# Patient Record
Sex: Male | Born: 1940 | Race: Black or African American | Hispanic: No | Marital: Married | Smoking: Never smoker
Health system: Southern US, Community
[De-identification: ages and names within clinical notes are randomized; demographics above are authoritative.]

---

## 2016-12-05 ENCOUNTER — Emergency Department (HOSPITAL_COMMUNITY): Payer: Medicare (Managed Care)

## 2016-12-05 ENCOUNTER — Encounter (HOSPITAL_COMMUNITY): Payer: Self-pay | Admitting: Emergency Medicine

## 2016-12-05 ENCOUNTER — Inpatient Hospital Stay (HOSPITAL_COMMUNITY)
Admission: EM | Admit: 2016-12-05 | Discharge: 2016-12-08 | DRG: 871 | Disposition: A | Discharge status: Home / Self Care | Payer: Medicare (Managed Care) | Attending: Internal Medicine | Admitting: Internal Medicine

## 2016-12-05 DIAGNOSIS — Z23 Encounter for immunization: Secondary | ICD-10-CM

## 2016-12-05 DIAGNOSIS — G9340 Encephalopathy, unspecified: Secondary | ICD-10-CM | POA: Diagnosis present

## 2016-12-05 DIAGNOSIS — Z885 Allergy status to narcotic agent status: Secondary | ICD-10-CM | POA: Diagnosis not present

## 2016-12-05 DIAGNOSIS — J9601 Acute respiratory failure with hypoxia: Secondary | ICD-10-CM | POA: Diagnosis present

## 2016-12-05 DIAGNOSIS — N179 Acute kidney failure, unspecified: Secondary | ICD-10-CM | POA: Diagnosis present

## 2016-12-05 DIAGNOSIS — J449 Chronic obstructive pulmonary disease, unspecified: Secondary | ICD-10-CM | POA: Diagnosis present

## 2016-12-05 DIAGNOSIS — E876 Hypokalemia: Secondary | ICD-10-CM | POA: Diagnosis present

## 2016-12-05 DIAGNOSIS — N289 Disorder of kidney and ureter, unspecified: Secondary | ICD-10-CM

## 2016-12-05 DIAGNOSIS — K219 Gastro-esophageal reflux disease without esophagitis: Secondary | ICD-10-CM | POA: Diagnosis present

## 2016-12-05 DIAGNOSIS — J181 Lobar pneumonia, unspecified organism: Secondary | ICD-10-CM | POA: Diagnosis not present

## 2016-12-05 DIAGNOSIS — Z79899 Other long term (current) drug therapy: Secondary | ICD-10-CM

## 2016-12-05 DIAGNOSIS — D649 Anemia, unspecified: Secondary | ICD-10-CM | POA: Diagnosis present

## 2016-12-05 DIAGNOSIS — E860 Dehydration: Secondary | ICD-10-CM | POA: Diagnosis present

## 2016-12-05 DIAGNOSIS — G9341 Metabolic encephalopathy: Secondary | ICD-10-CM | POA: Diagnosis present

## 2016-12-05 DIAGNOSIS — J189 Pneumonia, unspecified organism: Secondary | ICD-10-CM | POA: Diagnosis present

## 2016-12-05 DIAGNOSIS — Z888 Allergy status to other drugs, medicaments and biological substances status: Secondary | ICD-10-CM | POA: Diagnosis not present

## 2016-12-05 DIAGNOSIS — I499 Cardiac arrhythmia, unspecified: Secondary | ICD-10-CM | POA: Diagnosis present

## 2016-12-05 DIAGNOSIS — R262 Difficulty in walking, not elsewhere classified: Secondary | ICD-10-CM

## 2016-12-05 DIAGNOSIS — A419 Sepsis, unspecified organism: Secondary | ICD-10-CM | POA: Diagnosis not present

## 2016-12-05 DIAGNOSIS — J440 Chronic obstructive pulmonary disease with acute lower respiratory infection: Secondary | ICD-10-CM | POA: Diagnosis present

## 2016-12-05 DIAGNOSIS — Z886 Allergy status to analgesic agent status: Secondary | ICD-10-CM

## 2016-12-05 LAB — CBC WITH DIFFERENTIAL/PLATELET
Basophils Absolute: 0 10*3/uL (ref 0.0–0.1)
Basophils Relative: 0 %
EOS ABS: 0 10*3/uL (ref 0.0–0.7)
EOS PCT: 0 %
HEMATOCRIT: 39 % (ref 39.0–52.0)
HEMOGLOBIN: 12.7 g/dL — AB (ref 13.0–17.0)
LYMPHS ABS: 1.2 10*3/uL (ref 0.7–4.0)
LYMPHS PCT: 10 %
MCH: 29.1 pg (ref 26.0–34.0)
MCHC: 32.6 g/dL (ref 30.0–36.0)
MCV: 89.4 fL (ref 78.0–100.0)
MONO ABS: 0.6 10*3/uL (ref 0.1–1.0)
MONOS PCT: 5 %
Neutro Abs: 10.6 10*3/uL — ABNORMAL HIGH (ref 1.7–7.7)
Neutrophils Relative %: 85 %
Platelets: 191 10*3/uL (ref 150–400)
RBC: 4.36 MIL/uL (ref 4.22–5.81)
RDW: 13 % (ref 11.5–15.5)
WBC: 12.4 10*3/uL — AB (ref 4.0–10.5)

## 2016-12-05 LAB — COMPREHENSIVE METABOLIC PANEL
ALK PHOS: 63 U/L (ref 38–126)
ALT: 20 U/L (ref 17–63)
AST: 40 U/L (ref 15–41)
Albumin: 3.7 g/dL (ref 3.5–5.0)
Anion gap: 10 (ref 5–15)
BUN: 17 mg/dL (ref 6–20)
CALCIUM: 8.7 mg/dL — AB (ref 8.9–10.3)
CHLORIDE: 105 mmol/L (ref 101–111)
CO2: 23 mmol/L (ref 22–32)
CREATININE: 1.27 mg/dL — AB (ref 0.61–1.24)
GFR calc non Af Amer: 53 mL/min — ABNORMAL LOW (ref >60)
GLUCOSE: 110 mg/dL — AB (ref 65–99)
Potassium: 3.4 mmol/L — ABNORMAL LOW (ref 3.5–5.1)
SODIUM: 138 mmol/L (ref 135–145)
Total Bilirubin: 1.6 mg/dL — ABNORMAL HIGH (ref 0.3–1.2)
Total Protein: 6.3 g/dL — ABNORMAL LOW (ref 6.5–8.1)

## 2016-12-05 LAB — URINALYSIS, ROUTINE W REFLEX MICROSCOPIC
BACTERIA UA: NONE SEEN
Bilirubin Urine: NEGATIVE
GLUCOSE, UA: NEGATIVE mg/dL
KETONES UR: NEGATIVE mg/dL
Leukocytes, UA: NEGATIVE
Nitrite: NEGATIVE
PROTEIN: 100 mg/dL — AB
Specific Gravity, Urine: 1.027 (ref 1.005–1.030)
pH: 5 (ref 5.0–8.0)

## 2016-12-05 LAB — I-STAT CG4 LACTIC ACID, ED
LACTIC ACID, VENOUS: 1.16 mmol/L (ref 0.5–1.9)
Lactic Acid, Venous: 1.64 mmol/L (ref 0.5–1.9)

## 2016-12-05 MED ORDER — ACETAMINOPHEN 325 MG PO TABS
650.0000 mg | ORAL_TABLET | Freq: Once | ORAL | Status: AC
  Administered 2016-12-05: 650 mg via ORAL
  Filled 2016-12-05: qty 2

## 2016-12-05 MED ORDER — LEVOFLOXACIN IN D5W 500 MG/100ML IV SOLN
500.0000 mg | Freq: Once | INTRAVENOUS | Status: AC
  Administered 2016-12-05: 500 mg via INTRAVENOUS
  Filled 2016-12-05: qty 100

## 2016-12-05 MED ORDER — SODIUM CHLORIDE 0.9 % IV SOLN
INTRAVENOUS | Status: AC
  Administered 2016-12-05 – 2016-12-06 (×2): via INTRAVENOUS

## 2016-12-05 MED ORDER — PANTOPRAZOLE SODIUM 40 MG PO TBEC
40.0000 mg | DELAYED_RELEASE_TABLET | Freq: Every day | ORAL | Status: DC
  Administered 2016-12-06 – 2016-12-08 (×3): 40 mg via ORAL
  Filled 2016-12-05 (×3): qty 1

## 2016-12-05 MED ORDER — DILTIAZEM HCL ER COATED BEADS 120 MG PO CP24
120.0000 mg | ORAL_CAPSULE | Freq: Every day | ORAL | Status: DC
  Administered 2016-12-06 – 2016-12-08 (×3): 120 mg via ORAL
  Filled 2016-12-05 (×3): qty 1

## 2016-12-05 MED ORDER — LATANOPROST 0.005 % OP SOLN
1.0000 [drp] | Freq: Every day | OPHTHALMIC | Status: DC
  Administered 2016-12-05 – 2016-12-07 (×3): 1 [drp] via OPHTHALMIC
  Filled 2016-12-05: qty 2.5

## 2016-12-05 MED ORDER — ACETAMINOPHEN 325 MG PO TABS
650.0000 mg | ORAL_TABLET | Freq: Four times a day (QID) | ORAL | Status: DC | PRN
  Administered 2016-12-05 – 2016-12-08 (×4): 650 mg via ORAL
  Filled 2016-12-05 (×4): qty 2

## 2016-12-05 MED ORDER — LEVOFLOXACIN IN D5W 500 MG/100ML IV SOLN
500.0000 mg | INTRAVENOUS | Status: DC
  Administered 2016-12-06: 500 mg via INTRAVENOUS
  Filled 2016-12-05 (×2): qty 100

## 2016-12-05 MED ORDER — GUAIFENESIN ER 600 MG PO TB12
600.0000 mg | ORAL_TABLET | Freq: Two times a day (BID) | ORAL | Status: DC | PRN
  Administered 2016-12-05: 600 mg via ORAL
  Filled 2016-12-05: qty 1

## 2016-12-05 MED ORDER — ENOXAPARIN SODIUM 40 MG/0.4ML ~~LOC~~ SOLN
40.0000 mg | SUBCUTANEOUS | Status: DC
  Administered 2016-12-05 – 2016-12-07 (×3): 40 mg via SUBCUTANEOUS
  Filled 2016-12-05 (×3): qty 0.4

## 2016-12-05 MED ORDER — ONDANSETRON HCL 4 MG/2ML IJ SOLN
4.0000 mg | Freq: Four times a day (QID) | INTRAMUSCULAR | Status: DC | PRN
  Filled 2016-12-05: qty 2

## 2016-12-05 MED ORDER — POTASSIUM CHLORIDE CRYS ER 20 MEQ PO TBCR
20.0000 meq | EXTENDED_RELEASE_TABLET | Freq: Two times a day (BID) | ORAL | Status: DC
  Administered 2016-12-05 – 2016-12-08 (×6): 20 meq via ORAL
  Filled 2016-12-05 (×6): qty 1

## 2016-12-05 MED ORDER — IPRATROPIUM-ALBUTEROL 0.5-2.5 (3) MG/3ML IN SOLN: 3.0000 mL | RESPIRATORY_TRACT | Status: DC | PRN

## 2016-12-05 MED ORDER — SODIUM CHLORIDE 0.9 % IV BOLUS (SEPSIS)
1000.0000 mL | Freq: Once | INTRAVENOUS | Status: AC
  Administered 2016-12-05: 1000 mL via INTRAVENOUS

## 2016-12-05 NOTE — ED Triage Notes (Signed)
Pt here with family for AMS and cough with congestion and fever; per family pt arrived here from [place] last week to visit; pt not acting normally; pt noted to be hypotensive

## 2016-12-05 NOTE — H&P (Signed)
History and Physical    [name]:[id number] DOB: 02/01/1941 DOA: 12/05/2016  PCP: System, Pcp Not In   Patient coming from: Home; from [place], here visiting family   Chief Complaint: Fevers, cough, confusion   HPI: Vincent Costa is a 76 y.o. male with medical history significant for COPD, GERD, and a cardiac arrhythmia for which he takes diltiazem, who presented to the emergency department for evaluation of fevers, chills, cough, and confusion. Patient is accompanied by his daughter who assists with history. He reports that he had been in his usual state of health until one week ago when he noted the development of mild dyspnea and cough. He reports that the symptoms were mild when he arrived here in [place] from [place] one week ago to visit a local family. Symptoms continued to be mild in until 3 days ago when the patient was noted by family to be dyspneic and coughing. By the following day, he was demonstrating intermittent confusion, was noted to have a fever, and had loss of appetite. He was taken by family to a clinic yesterday for evaluation and he was given an antibiotic and inhaler. He continued to worsen back at home, up most of the night with dyspnea and coughing, and is brought into the ED for evaluation.  ED Course: Upon arrival to the ED, patient is found to be febrile to 38.1 C, saturating low 90s on room air, mildly tachypneic, slightly tachycardic, and with soft but stable blood pressure. EKG features a sinus rhythm with RSR' in V1. Chest x-ray features airspace densities in the right middle lobe concerning for pneumonia. Chemistry panels notable for a potassium 3.4 and creatinine of 1.27 with no prior available for comparison. CBC features a leukocytosis 12,400 and a slight normocytic anemia with hemoglobin of 12.7. Lactic acid is reassuring at 1.64 and urinalysis is not suggestive of infection. Patient was given a liter of normal saline, acetaminophen, and empiric  Levaquin in the ED. Tachycardia resolved and blood pressure improved with IV fluids. Patient remains dyspneic with minimal exertion, but is not in acute distress and will be admitted to the medical/surgical unit for ongoing evaluation and management fevers, cough, and confusion, secondary to acquired pneumonia.  Review of Systems:  All other systems reviewed and apart from HPI, are negative.  History reviewed. No pertinent past medical history.  History reviewed. No pertinent surgical history.   reports that he has never smoked. He has never used smokeless tobacco. He reports that he does not drink alcohol or use drugs.  Allergies  Allergen Reactions  . Aspirin   . Codeine   . Lyrica [Pregabalin]     History reviewed. No pertinent family history.   Prior to Admission medications   Medication Sig Start Date End Date Taking? Authorizing Provider  diltiazem (DILACOR XR) 120 MG 24 hr capsule Take 120 mg by mouth daily.   Yes [provider]  latanoprost (XALATAN) 0.005 % ophthalmic solution Place 1 drop into both eyes at bedtime.   Yes [provider]  pantoprazole (PROTONIX) 40 MG tablet Take 40 mg by mouth daily.   Yes [provider]  potassium chloride SA (K-DUR,KLOR-CON) 20 MEQ tablet Take 20 mEq by mouth 2 (two) times daily.   Yes [provider]    Physical Exam: Vitals:   12/05/16 1700 12/05/16 1715 12/05/16 1730 12/05/16 1745  BP: (!) 112/58 124/62 (!) 110/58 (!) 104/58  Pulse:      Resp: (!) 25 (!) 27 Marland Kitchen)  28 (!) 22  Temp:      TempSrc:      SpO2:      Weight:      Height:          Constitutional: No acute distress, but mildly tachypneic, and dyspneic with speech. No pallor. No diaphoresis.  Eyes: PERTLA, lids and conjunctivae normal ENMT: Mucous membranes are moist. Posterior pharynx clear of any exudate or lesions.   Neck: normal, supple, no masses, no thyromegaly Respiratory: Rhonchi at right base. Occasional expiratory  wheeze. Mild tachypnea. No accessory muscle use.  Cardiovascular: S1 & S2 heard, regular rate and rhythm. No extremity edema. No significant JVD. Abdomen: No distension, no tenderness, no masses palpated. Bowel sounds normal.  Musculoskeletal: no clubbing / cyanosis. No joint deformity upper and lower extremities.  Skin: no significant rashes, lesions, ulcers. Warm, dry, well-perfused. Neurologic: CN 2-12 grossly intact. Sensation intact, DTR normal. Strength 5/5 in all 4 limbs.  Psychiatric: Alert and oriented to person and place; not oriented to month or year. Pleasant and cooperative.     Labs on Admission: I have personally reviewed following labs and imaging studies  CBC:  Recent Labs Lab 12/05/16 1500  WBC 12.4*  NEUTROABS 10.6*  HGB 12.7*  HCT 39.0  MCV 89.4  PLT 191   Basic Metabolic Panel:  Recent Labs Lab 12/05/16 1500  NA 138  K 3.4*  CL 105  CO2 23  GLUCOSE 110*  BUN 17  CREATININE 1.27*  CALCIUM 8.7*   GFR: Estimated Creatinine Clearance: 45.6 mL/min (A) (by C-G formula based on SCr of 1.27 mg/dL (H)). Liver Function Tests:  Recent Labs Lab 12/05/16 1500  AST 40  ALT 20  ALKPHOS 63  BILITOT 1.6*  PROT 6.3*  ALBUMIN 3.7   No results for input(s): LIPASE, AMYLASE in the last 168 hours. No results for input(s): AMMONIA in the last 168 hours. Coagulation Profile: No results for input(s): INR, PROTIME in the last 168 hours. Cardiac Enzymes: No results for input(s): CKTOTAL, CKMB, CKMBINDEX, TROPONINI in the last 168 hours. BNP (last 3 results) No results for input(s): PROBNP in the last 8760 hours. HbA1C: No results for input(s): HGBA1C in the last 72 hours. CBG: No results for input(s): GLUCAP in the last 168 hours. Lipid Profile: No results for input(s): CHOL, HDL, LDLCALC, TRIG, CHOLHDL, LDLDIRECT in the last 72 hours. Thyroid Function Tests: No results for input(s): TSH, T4TOTAL, FREET4, T3FREE, THYROIDAB in the last 72 hours. Anemia  Panel: No results for input(s): VITAMINB12, FOLATE, FERRITIN, TIBC, IRON, RETICCTPCT in the last 72 hours. Urine analysis:    Component Value Date/Time   COLORURINE AMBER (A) 12/05/2016 1644   APPEARANCEUR HAZY (A) 12/05/2016 1644   LABSPEC 1.027 12/05/2016 1644   PHURINE 5.0 12/05/2016 1644   GLUCOSEU NEGATIVE 12/05/2016 1644   HGBUR MODERATE (A) 12/05/2016 1644   BILIRUBINUR NEGATIVE 12/05/2016 1644   KETONESUR NEGATIVE 12/05/2016 1644   PROTEINUR 100 (A) 12/05/2016 1644   NITRITE NEGATIVE 12/05/2016 1644   LEUKOCYTESUR NEGATIVE 12/05/2016 1644   Sepsis Labs: @LABRCNTIP (procalcitonin:4,lacticidven:4) )No results found for this or any previous visit (from the past 240 hour(s)).   Radiological Exams on Admission: Dg Chest 2 View  Result Date: 12/05/2016 CLINICAL DATA:  Altered mental status and cough and congestion. Fever. EXAM: CHEST  2 VIEW COMPARISON:  None. FINDINGS: Evidence for at least mild hyperinflation. There is concern for patchy densities along the medial right lung base which may be in the right middle lobe based  on the lateral view. Bridging osteophytes in the thoracic spine. No large pleural effusions. Heart size is normal. IMPRESSION: Poorly defined densities in the right middle lobe region. Findings are concerning for pneumonia. Followup PA and lateral chest X-ray is recommended in 3-4 weeks following trial of antibiotic therapy to ensure resolution and exclude underlying malignancy. Electronically Signed   By: Richarda OverlieAdam  Henn M.D.   On: 12/05/2016 15:14    EKG: Independently reviewed. Sinus rhythm, RSR' in V1.   Assessment/Plan  1. CAP  - Pt presents with fevers, cough, confusion; found to have fever, leukocytosis, and RML PNA  - Given age, confusion, and underlying COPD, will require admission for initial management  - He was treated with a liter of NS and empiric Levaquin in ED  - Check sputum culture, check for strep pneumo antigen, continue empiric Levaquin  -  Supportive care with prn analgesia, antipyretics, mucolytics, supplemental O2, nebs  2. Acute encephalopathy  - Pt noted by family to develop intermittent confusion over 2-3 days leading up to admission  - No focal neurologic deficits; likely secondary to PNA and fevers  - Anticipate resolution with treatment of PNA as above   3. Mild renal insufficiency  - SCr is 1.27 on admission with no prior available for comparison  - Possibly an acute prerenal azotemia in setting of acute infection with fevers and anorexia  - He appears dry and was given 1 liter of NS in ED; continue IVF hydration with NS, repeat chem panel in am    4. COPD  - Stable; no home O2 requirement  - Continue prn DuoNebs   5. Hx of cardiac dysrhythmia  - Pt in sinus rhythm with normal rate on admission  - He reports hx of heart "going to fast and too slow," but unable to clarify further  - Continue diltiazem    DVT prophylaxis: sq Lovenox  Code Status: Full  Family Communication: Daughter updated at bedside Disposition Plan: Admit to med-surg Consults called: None Admission status: Inpatient    Briscoe Deutscherimothy S Dashiel Bergquist, MD [facility] Pager [phone]  If 7PM-7AM, please contact night-coverage [email or web address] Password [email or web address]  12/05/2016, 7:16 PM

## 2016-12-05 NOTE — Progress Notes (Signed)
PHARMACY NOTE:  ANTIMICROBIAL RENAL DOSAGE ADJUSTMENT  Current antimicrobial regimen includes a mismatch between antimicrobial dosage and estimated renal function.  As per policy approved by the Pharmacy & Therapeutics and Medical Executive Committees, the antimicrobial dosage will be adjusted accordingly.  Current antimicrobial dosage:  Levofloxacin 750 mg IV daily  Indication: CAP  Renal Function:  Estimated Creatinine Clearance: 45.6 mL/min (A) (by C-G formula based on SCr of 1.27 mg/dL (H)).     Antimicrobial dosage has been changed to:  Levofloxacin 500 mg IV daily   Thank you for allowing pharmacy to be a part of this patient's care.  Tenna ChildRenee J Ackley, [facility]ahRPH 12/05/2016 7:45 PM

## 2016-12-05 NOTE — ED Notes (Signed)
I did in and out cath on patient assisted by RN

## 2016-12-05 NOTE — ED Notes (Signed)
Attempted report x1. 

## 2016-12-05 NOTE — ED Provider Notes (Signed)
[facility]-EMER[facility]ENCY DEPT Provider Note   CSN: [id number] Arrival date & time: 12/05/16  1440     History   Chief Complaint Chief Complaint  Patient presents with  . Altered Mental Status  . Cough    HPI Vincent Costa is a 76 y.o. male.  HPI Patient is visiting family from [place]. Began to have a productive cough of green sputum and confusion starting on Sunday. Has progressed to fevers and shaking chills. Patient also complains of right-sided chest pain is worse with deep breathing and coughing. No new lower extremity swelling or pain. Was seen at urgent care facility and given antibiotic and cough medication yesterday. Patient also complains of lightheadedness and dizziness with standing. History reviewed. No pertinent past medical history.  Patient Active Problem List   Diagnosis Date Noted  . Acute encephalopathy 12/05/2016  . CAP (community acquired pneumonia) 12/05/2016  . Renal insufficiency, mild 12/05/2016  . GERD (gastroesophageal reflux disease) 12/05/2016    History reviewed. No pertinent surgical history.     Home Medications    Prior to Admission medications   Medication Sig Start Date End Date Taking? Authorizing Provider  diltiazem (DILACOR XR) 120 MG 24 hr capsule Take 120 mg by mouth daily.   Yes [provider]  latanoprost (XALATAN) 0.005 % ophthalmic solution Place 1 drop into both eyes at bedtime.   Yes [provider]  pantoprazole (PROTONIX) 40 MG tablet Take 40 mg by mouth daily.   Yes [provider]  potassium chloride SA (K-DUR,KLOR-CON) 20 MEQ tablet Take 20 mEq by mouth 2 (two) times daily.   Yes [provider]    Family History History reviewed. No pertinent family history.  Social History Social History  Substance Use Topics  . Smoking status: Never Smoker  . Smokeless tobacco: Never Used  . Alcohol use No     Allergies   Aspirin; Codeine; and Lyrica [pregabalin]   Review of  Systems Review of Systems  Constitutional: Positive for chills, fatigue and fever.  HENT: Positive for congestion, rhinorrhea and sore throat.   Respiratory: Positive for cough and shortness of breath. Negative for wheezing.   Cardiovascular: Positive for chest pain. Negative for palpitations and leg swelling.  Gastrointestinal: Negative for abdominal pain, constipation, diarrhea, nausea and vomiting.  Genitourinary: Negative for dysuria, flank pain and frequency.  Musculoskeletal: Negative for back pain, myalgias, neck pain and neck stiffness.  Skin: Negative for rash and wound.  Neurological: Positive for dizziness and light-headedness. Negative for syncope, weakness, numbness and headaches.  Psychiatric/Behavioral: Positive for confusion.  All other systems reviewed and are negative.    Physical Exam Updated Vital Signs BP (!) 104/58   Pulse (!) 102   Temp (!) 100.5 F (38.1 C) (Oral)   Resp (!) 22   Ht 6\' 1"  (1.854 m)   Wt 66.2 kg (146 lb)   SpO2 94%   BMI 19.26 kg/m   Physical Exam  Constitutional: He is oriented to person, place, and time. He appears well-developed and well-nourished. No distress.  HENT:  Head: Normocephalic and atraumatic.  Mouth/Throat: Oropharynx is clear and moist. No oropharyngeal exudate.  Eyes: EOM are normal. Pupils are equal, round, and reactive to light.  Neck: Normal range of motion. Neck supple. No JVD present.  Cardiovascular: Normal rate and regular rhythm.  Exam reveals no gallop and no friction rub.   No murmur heard. Pulmonary/Chest: Effort normal. He has rales.  Patient with right sided rales. No respiratory distress.  Abdominal:  Soft. Bowel sounds are normal. There is no tenderness. There is no rebound and no guarding.  Musculoskeletal: Normal range of motion. He exhibits no edema or tenderness.  Braces on bilateral lower extremities. No swelling or asymmetry.  Neurological: He is alert and oriented to person, place, and time.   Moving all extremities without focal deficit. Sensation fully intact.  Skin: Skin is warm and dry. Capillary refill takes less than 2 seconds. No rash noted. No erythema.  Psychiatric: He has a normal mood and affect. His behavior is normal.  Nursing note and vitals reviewed.    ED Treatments / Results  Labs (all labs ordered are listed, but only abnormal results are displayed) Labs Reviewed  COMPREHENSIVE METABOLIC PANEL - Abnormal; Notable for the following:       Result Value   Potassium 3.4 (*)    Glucose, Bld 110 (*)    Creatinine, Ser 1.27 (*)    Calcium 8.7 (*)    Total Protein 6.3 (*)    Total Bilirubin 1.6 (*)    GFR calc non Af Amer 53 (*)    All other components within normal limits  CBC WITH DIFFERENTIAL/PLATELET - Abnormal; Notable for the following:    WBC 12.4 (*)    Hemoglobin 12.7 (*)    Neutro Abs 10.6 (*)    All other components within normal limits  URINALYSIS, ROUTINE W REFLEX MICROSCOPIC - Abnormal; Notable for the following:    Color, Urine AMBER (*)    APPearance HAZY (*)    Hgb urine dipstick MODERATE (*)    Protein, ur 100 (*)    Squamous Epithelial / LPF 0-5 (*)    All other components within normal limits  CULTURE, BLOOD (ROUTINE X 2)  CULTURE, BLOOD (ROUTINE X 2)  I-STAT CG4 LACTIC ACID, ED  I-STAT CG4 LACTIC ACID, ED    EKG  EKG Interpretation  Date/Time:  Wednesday Dec 05 2016 16:07:54 EDT Ventricular Rate:  89 PR Interval:    QRS Duration: 92 QT Interval:  356 QTC Calculation: 434 R Axis:   85 Text Interpretation:  Sinus rhythm Borderline right axis deviation RSR' in V1 or V2, probably normal variant Probable anteroseptal infarct, old Confirmed by Ranae Palms  MD, Osama Coleson ([id number]) on 12/05/2016 4:22:19 PM       Radiology Dg Chest 2 View  Result Date: 12/05/2016 CLINICAL DATA:  Altered mental status and cough and congestion. Fever. EXAM: CHEST  2 VIEW COMPARISON:  None. FINDINGS: Evidence for at least mild hyperinflation. There is  concern for patchy densities along the medial right lung base which may be in the right middle lobe based on the lateral view. Bridging osteophytes in the thoracic spine. No large pleural effusions. Heart size is normal. IMPRESSION: Poorly defined densities in the right middle lobe region. Findings are concerning for pneumonia. Followup PA and lateral chest X-ray is recommended in 3-4 weeks following trial of antibiotic therapy to ensure resolution and exclude underlying malignancy. Electronically Signed   By: Richarda Overlie M.D.   On: 12/05/2016 15:14    Procedures Procedures (including critical care time)  Medications Ordered in ED Medications  levofloxacin (LEVAQUIN) IVPB 500 mg (0 mg Intravenous Stopped 12/05/16 1806)  sodium chloride 0.9 % bolus 1,000 mL (1,000 mLs Intravenous New Bag/Given 12/05/16 1648)  acetaminophen (TYLENOL) tablet 650 mg (650 mg Oral Given 12/05/16 1704)     Initial Impression / Assessment and Plan / ED Course  I have reviewed the triage vital signs and the nursing  notes.  Pertinent labs & imaging results that were available during my care of the patient were reviewed by me and considered in my medical decision making (see chart for details).     Started on IV fluids and antibiotics. Lactic acid is normal. Blood pressure significantly improved after IV fluids. Discussed with hospitalist and we'll admit. Final Clinical Impressions(s) / ED Diagnoses   Final diagnoses:  Community acquired pneumonia of right middle lobe of lung (HCC)  Dehydration    New Prescriptions New Prescriptions   No medications on file     Loren RacerYelverton, [place], MD 12/05/16 1825

## 2016-12-06 DIAGNOSIS — D649 Anemia, unspecified: Secondary | ICD-10-CM

## 2016-12-06 DIAGNOSIS — E876 Hypokalemia: Secondary | ICD-10-CM

## 2016-12-06 LAB — CBC WITH DIFFERENTIAL/PLATELET
BASOS ABS: 0 10*3/uL (ref 0.0–0.1)
BASOS PCT: 0 %
Eosinophils Absolute: 0 10*3/uL (ref 0.0–0.7)
Eosinophils Relative: 0 %
HEMATOCRIT: 34.2 % — AB (ref 39.0–52.0)
Hemoglobin: 10.9 g/dL — ABNORMAL LOW (ref 13.0–17.0)
LYMPHS PCT: 11 %
Lymphs Abs: 1.1 10*3/uL (ref 0.7–4.0)
MCH: 28.3 pg (ref 26.0–34.0)
MCHC: 31.9 g/dL (ref 30.0–36.0)
MCV: 88.8 fL (ref 78.0–100.0)
MONOS PCT: 6 %
Monocytes Absolute: 0.6 10*3/uL (ref 0.1–1.0)
NEUTROS ABS: 8 10*3/uL — AB (ref 1.7–7.7)
NEUTROS PCT: 83 %
Platelets: 166 10*3/uL (ref 150–400)
RBC: 3.85 MIL/uL — ABNORMAL LOW (ref 4.22–5.81)
RDW: 13.1 % (ref 11.5–15.5)
WBC: 9.7 10*3/uL (ref 4.0–10.5)

## 2016-12-06 LAB — HIV ANTIBODY (ROUTINE TESTING W REFLEX): HIV Screen 4th Generation wRfx: NONREACTIVE

## 2016-12-06 LAB — BASIC METABOLIC PANEL
ANION GAP: 5 (ref 5–15)
BUN: 17 mg/dL (ref 6–20)
CALCIUM: 8.1 mg/dL — AB (ref 8.9–10.3)
CHLORIDE: 109 mmol/L (ref 101–111)
CO2: 25 mmol/L (ref 22–32)
CREATININE: 1.04 mg/dL (ref 0.61–1.24)
GLUCOSE: 93 mg/dL (ref 65–99)
Potassium: 3.4 mmol/L — ABNORMAL LOW (ref 3.5–5.1)
Sodium: 139 mmol/L (ref 135–145)

## 2016-12-06 LAB — MAGNESIUM: Magnesium: 2 mg/dL (ref 1.7–2.4)

## 2016-12-06 LAB — STREP PNEUMONIAE URINARY ANTIGEN: STREP PNEUMO URINARY ANTIGEN: NEGATIVE

## 2016-12-06 MED ORDER — POTASSIUM CHLORIDE CRYS ER 20 MEQ PO TBCR
40.0000 meq | EXTENDED_RELEASE_TABLET | Freq: Once | ORAL | Status: AC
  Administered 2016-12-06: 40 meq via ORAL
  Filled 2016-12-06: qty 2

## 2016-12-06 MED ORDER — GUAIFENESIN ER 600 MG PO TB12
600.0000 mg | ORAL_TABLET | Freq: Four times a day (QID) | ORAL | Status: DC
  Administered 2016-12-06 (×3): 600 mg via ORAL
  Filled 2016-12-06 (×3): qty 1

## 2016-12-06 MED ORDER — PNEUMOCOCCAL VAC POLYVALENT 25 MCG/0.5ML IJ INJ
0.5000 mL | INJECTION | INTRAMUSCULAR | Status: AC
  Administered 2016-12-08: 0.5 mL via INTRAMUSCULAR
  Filled 2016-12-06: qty 0.5

## 2016-12-06 NOTE — Progress Notes (Signed)
PROGRESS NOTE    Jaquelyn BitterStephen Backhaus  VHQ:[id number] DOB: 04/11/1941 DOA: 12/05/2016 PCP: System, Pcp Not In   Brief Narrative:   76 year old male with past medical history of COPD, GERD admitted to the hospital for change in mental status and found to have right middle lobe pneumonia. He was started on Levaquin.   Assessment & Plan:   Principal Problem:   CAP (community acquired pneumonia) Active Problems:   Acute encephalopathy   Renal insufficiency, mild   GERD (gastroesophageal reflux disease)   COPD (chronic obstructive pulmonary disease) (HCC)   Acute metabolic encephalopathy; improved Community-acquired pneumonia -Supplemental oxygen, follow-up cultures -DuoNeb's every 6 hours, mucolytics -Continue Levaquin IV at this time. Change to by mouth when able -Chest x-ray from admission shows right middle lobe pneumonia -Strep pneumonia urine antigen negative -unLikely aspiration  Mild renal insufficiency -Creatinine has trended down with IV fluids. 1.27-1.04 -For toxic drugs and closely monitor  Lab Results  Component Value Date   CREATININE 1.04 12/06/2016   CREATININE 1.27 (H) 12/05/2016    Hypokalemia -Replete potassium  Ambulatory dysfunction History of bilateral lower extremity foot drop -Has fluid brace bilaterally and uses cane and walker at times -We'll consult physical therapy  Anemia -Unknown etiology. Delusional? -Can be evaluated outpatient  CBC Latest Ref Rng & Units 12/06/2016 12/05/2016  WBC 4.0 - 10.5 K/uL 9.7 12.4(H)  Hemoglobin 13.0 - 17.0 g/dL 10.9(L) 12.7(L)  Hematocrit 39.0 - 52.0 % 34.2(L) 39.0  Platelets 150 - 400 K/uL 166 191    History of COPD -Appears to be stable at this time. Continue DuoNeb's  History of cardiac dysrhythmia? -Currently normal sinus rhythm on the ekg at the time of admission.  -Continue Cardizem  DVT prophylaxis: Lovenox Code Status: Full Family Communication:  None at bedside Disposition Plan: Continue  inpatient treatment. Likely discharge in next 24-48 hours  Consultants:   None  Procedures:   None  Antimicrobials:   Levaquin 5/23 >   Subjective: Patient states he is feeling well better today but still has lot of mucus that he is coughing up which is greenish in color. He remains afebrile at this time. He also reports of dysfunction especially worse over past few days. Due to persistent coughing is minimal oral intake in the last 2 weeks.  Objective: Vitals:   12/05/16 2003 12/06/16 0200 12/06/16 0531 12/06/16 0955  BP: (!) 101/58 (!) 99/58 108/61 118/62  Pulse: 75 71 74   Resp: (!) 21 (!) 21 20   Temp: 98.6 F (37 C) 98.5 F (36.9 C) 98.5 F (36.9 C)   TempSrc: Oral Oral Oral   SpO2: 95% 98% 100%   Weight: 63.1 kg (139 lb 1.8 oz)     Height: 6\' 1"  (1.854 m)       Intake/Output Summary (Last 24 hours) at 12/06/16 1127 Last data filed at 12/06/16 0925  Gross per 24 hour  Intake          1798.33 ml  Output                0 ml  Net          1798.33 ml   Filed Weights   12/05/16 1545 12/05/16 2003  Weight: 66.2 kg (146 lb) 63.1 kg (139 lb 1.8 oz)    Examination:  General exam: Appears calm and comfortable  Respiratory system: Diffuse diminished breath sounds Cardiovascular system: S1 & S2 heard, RRR. No JVD, murmurs, rubs, gallops or clicks. No pedal edema. Gastrointestinal system: Abdomen  is nondistended, soft and nontender. No organomegaly or masses felt. Normal bowel sounds heard. Central nervous system: Alert and oriented. No focal neurological deficits. Extremities: Symmetric 5 x 5 power. With bilateral lower extremity foot drop. Cold to touch extremities Skin: No rashes, lesions or ulcers Psychiatry: Judgement and insight appear normal. Mood & affect appropriate.     Data Reviewed:   CBC:  Recent Labs Lab 12/05/16 1500 12/06/16 0315  WBC 12.4* 9.7  NEUTROABS 10.6* 8.0*  HGB 12.7* 10.9*  HCT 39.0 34.2*  MCV 89.4 88.8  PLT 191 166   Basic  Metabolic Panel:  Recent Labs Lab 12/05/16 1500 12/06/16 0315  NA 138 139  K 3.4* 3.4*  CL 105 109  CO2 23 25  GLUCOSE 110* 93  BUN 17 17  CREATININE 1.27* 1.04  CALCIUM 8.7* 8.1*  MG  --  2.0   GFR: Estimated Creatinine Clearance: 53.1 mL/min (by C-G formula based on SCr of 1.04 mg/dL). Liver Function Tests:  Recent Labs Lab 12/05/16 1500  AST 40  ALT 20  ALKPHOS 63  BILITOT 1.6*  PROT 6.3*  ALBUMIN 3.7   No results for input(s): LIPASE, AMYLASE in the last 168 hours. No results for input(s): AMMONIA in the last 168 hours. Coagulation Profile: No results for input(s): INR, PROTIME in the last 168 hours. Cardiac Enzymes: No results for input(s): CKTOTAL, CKMB, CKMBINDEX, TROPONINI in the last 168 hours. BNP (last 3 results) No results for input(s): PROBNP in the last 8760 hours. HbA1C: No results for input(s): HGBA1C in the last 72 hours. CBG: No results for input(s): GLUCAP in the last 168 hours. Lipid Profile: No results for input(s): CHOL, HDL, LDLCALC, TRIG, CHOLHDL, LDLDIRECT in the last 72 hours. Thyroid Function Tests: No results for input(s): TSH, T4TOTAL, FREET4, T3FREE, THYROIDAB in the last 72 hours. Anemia Panel: No results for input(s): VITAMINB12, FOLATE, FERRITIN, TIBC, IRON, RETICCTPCT in the last 72 hours. Sepsis Labs:  Recent Labs Lab 12/05/16 1527 12/05/16 1823  LATICACIDVEN 1.64 1.16    No results found for this or any previous visit (from the past 240 hour(s)).       Radiology Studies: Dg Chest 2 View  Result Date: 12/05/2016 CLINICAL DATA:  Altered mental status and cough and congestion. Fever. EXAM: CHEST  2 VIEW COMPARISON:  None. FINDINGS: Evidence for at least mild hyperinflation. There is concern for patchy densities along the medial right lung base which may be in the right middle lobe based on the lateral view. Bridging osteophytes in the thoracic spine. No large pleural effusions. Heart size is normal. IMPRESSION: Poorly  defined densities in the right middle lobe region. Findings are concerning for pneumonia. Followup PA and lateral chest X-ray is recommended in 3-4 weeks following trial of antibiotic therapy to ensure resolution and exclude underlying malignancy. Electronically Signed   By: Richarda Overlie M.D.   On: 12/05/2016 15:14        Scheduled Meds: . diltiazem  120 mg Oral Daily  . enoxaparin (LOVENOX) injection  40 mg Subcutaneous Q24H  . guaiFENesin  600 mg Oral Q6H  . latanoprost  1 drop Both Eyes QHS  . pantoprazole  40 mg Oral Daily  . [START ON 12/07/2016] pneumococcal 23 valent vaccine  0.5 mL Intramuscular Tomorrow-1000  . potassium chloride SA  20 mEq Oral BID   Continuous Infusions: . levofloxacin (LEVAQUIN) IV       LOS: 1 day    Time spent: 35 mins     Larraine Argo  Joline Maxcy, MD [facility] Pager [phone]   If 7PM-7AM, please contact night-coverage [email or web address] Password [facility] 12/06/2016, 11:27 AM

## 2016-12-07 DIAGNOSIS — K219 Gastro-esophageal reflux disease without esophagitis: Secondary | ICD-10-CM

## 2016-12-07 DIAGNOSIS — E860 Dehydration: Secondary | ICD-10-CM

## 2016-12-07 MED ORDER — GUAIFENESIN ER 600 MG PO TB12
600.0000 mg | ORAL_TABLET | Freq: Two times a day (BID) | ORAL | Status: DC
  Administered 2016-12-07 – 2016-12-08 (×3): 600 mg via ORAL
  Filled 2016-12-07 (×3): qty 1

## 2016-12-07 MED ORDER — LEVOFLOXACIN 750 MG PO TABS
750.0000 mg | ORAL_TABLET | Freq: Every day | ORAL | Status: DC
  Administered 2016-12-07 – 2016-12-08 (×2): 750 mg via ORAL
  Filled 2016-12-07 (×2): qty 1

## 2016-12-07 NOTE — Progress Notes (Signed)
PHARMACY NOTE:  ANTIMICROBIAL RENAL DOSAGE ADJUSTMENT  Current antimicrobial regimen includes a mismatch between antimicrobial dosage and estimated renal function.  As per policy approved by the Pharmacy & Therapeutics and Medical Executive Committees, the antimicrobial dosage will be adjusted accordingly.  Current antimicrobial dosage:  Levofloxacin 500 mg IV daily  Indication: CAP  Renal Function:  Estimated Creatinine Clearance: 53.1 mL/min (by C-G formula based on SCr of 1.04 mg/dL).     Antimicrobial dosage has been changed to:  Increase Levaquin back to 750mg  daily, change to PO to complete the course   Vincent Costa D. Laney Potashang, PharmD, BCPS Pager:  [phone] 12/07/2016, 10:53 AM

## 2016-12-07 NOTE — Evaluation (Signed)
Physical Therapy Evaluation Patient Details Name: Vincent Costa MRN: [id number] DOB: 1941/07/01 Today's Date: 12/07/2016   History of Present Illness  75 y.o. male with medical history significant for COPD, GERD, and a cardiac arrhythmia for which he takes diltiazem, who presented to the emergency department for evaluation of fevers, chills, cough, and confusion. Pt currently being treated for pneumonia.  Clinical Impression  Pt admitted with above diagnosis. Pt currently with functional limitations due to the deficits listed below (see PT Problem List). Pt is mod I for bed mobility, min guard for transfer to RW, and minA for ambulation of 60 feet with bilateral AFOs and one seated rest break.  Pt will benefit from skilled PT to increase their independence and safety with mobility to allow discharge to the venue listed below.       Follow Up Recommendations Home health PT;Supervision/Assistance - 24 hour    Equipment Recommendations  Rolling walker with 5" wheels       Precautions / Restrictions Precautions Required Braces or Orthoses: Other Brace/Splint Other Brace/Splint: bilateral AFO L foot for 12 years R foot for 6 months      Mobility  Bed Mobility Overal bed mobility: Modified Independent             General bed mobility comments: utilizes bedrails to bring self to EoB  Transfers Overall transfer level: Needs assistance Equipment used: Rolling walker (2 wheeled) Transfers: Sit to/from Stand Sit to Stand: Min guard         General transfer comment: vc for scooting hips forward for power up and eccentric lowering to chair   Ambulation/Gait Ambulation/Gait assistance: Min assist Ambulation Distance (Feet): 60 Feet (2x30 one seated rest break) Assistive device: Rolling walker (2 wheeled) Gait Pattern/deviations: Step-through pattern;Decreased step length - right;Decreased step length - left;Narrow base of support Gait velocity: slowed Gait velocity  interpretation: Below normal speed for age/gender General Gait Details: minA for steadying vc for wider base of support, upright posture and anterior pelvic tilt      Balance Overall balance assessment: Needs assistance;History of Falls Sitting-balance support: Feet supported Sitting balance-Leahy Scale: Good     Standing balance support: Single extremity supported Standing balance-Leahy Scale: Fair Standing balance comment: needs RW for balance                             Pertinent Vitals/Pain Pain Assessment: Faces Faces Pain Scale: Hurts a little bit Pain Location: legs  Pain Descriptors / Indicators: Grimacing;Guarding (with movement) Pain Intervention(s): Repositioned;Monitored during session  SaO2 >93% on RA throughout session     Home Living Family/patient expects to be discharged to:: Private residence Living Arrangements: Children Available Help at Discharge: Family;Available 24 hours/day Type of Home: House Home Access: Stairs to enter Entrance Stairs-Rails: Can reach both Entrance Stairs-Number of Steps: 4 Home Layout: Able to live on main level with bedroom/bathroom;Multi-level Home Equipment: Walker - 4 wheels;Grab bars - tub/shower      Prior Function Level of Independence: Independent with assistive device(s)         Comments: community ambulator and driver        Extremity/Trunk Assessment   Upper Extremity Assessment Upper Extremity Assessment: Overall WFL for tasks assessed    Lower Extremity Assessment Lower Extremity Assessment: RLE deficits/detail;LLE deficits/detail RLE Deficits / Details: grossly 4/5 MMT for hip flex, knee flex and ankle plantarflexion, dorsiflexio 2/5 RLE Sensation: history of peripheral neuropathy (decreased sensation below knee) LLE Deficits /  Details: grossly 3+/5 MMT for hip flexion, knee flex/ext, and ankle plantarflexion dorsiflexion 2/5 LLE Sensation:  (decreased sensation below knee)    Cervical /  Trunk Assessment Cervical / Trunk Assessment: Normal  Communication   Communication: No difficulties  Cognition Arousal/Alertness: Awake/alert Behavior During Therapy: WFL for tasks assessed/performed Overall Cognitive Status: History of cognitive impairments - at baseline                                 General Comments: cognitive decline last 5 months exacerbated with hospital stay      General Comments General comments (skin integrity, edema, etc.): Daughter present for session, expressed concern for change in mental status        Assessment/Plan    PT Assessment Patient needs continued PT services  PT Problem List Decreased strength;Decreased activity tolerance;Decreased balance;Decreased mobility;Decreased cognition;Decreased safety awareness;Decreased knowledge of precautions;Impaired sensation       PT Treatment Interventions DME instruction;Gait training;Stair training;Functional mobility training;Therapeutic activities;Therapeutic exercise;Balance training;Cognitive remediation;Patient/family education    PT Goals (Current goals can be found in the Care Plan section)  Acute Rehab PT Goals Patient Stated Go[facility] Goal Formulation: With patient Time For Goal Achievement: 12/21/16 Potential to Achieve Goals: Good    Frequency Min 3X/week    AM-PAC PT "6 Clicks" Daily Activity  Outcome Measure Difficulty turning over in bed (including adjusting bedclothes, sheets and blankets)?: A Lot Difficulty moving from lying on back to sitting on the side of the bed? : A Lot Difficulty sitting down on and standing up from a chair with arms (e.g., wheelchair, bedside commode, etc,.)?: A Lot Help needed moving to and from a bed to chair (including a wheelchair)?: A Little Help needed walking in hospital room?: A Little Help needed climbing 3-5 steps with a railing? : A Lot 6 Click Score: 14    End of Session Equipment Utilized During  Treatment: Gait belt (AFOs) Activity Tolerance: Patient tolerated treatment well;Patient limited by fatigue Patient left: in chair;with call bell/phone within reach;with family/visitor present Nurse Communication: Mobility status PT Visit Diagnosis: Unsteadiness on feet (R26.81);Other abnormalities of gait and mobility (R26.89);Repeated falls (R29.6);Muscle weakness (generalized) (M62.81);Difficulty in walking, not elsewhere classified (R26.2)    Time: [id number] PT Time Calculation (min) (ACUTE ONLY): 50 min   Charges:   PT Evaluation $PT Eval Low Complexity: 1 Procedure PT Treatments $Gait Training: 8-22 mins $Therapeutic Activity: 8-22 mins   PT G Codes:        Jaden Abreu B. Beverely RisenVan Fleet PT, DPT Acute Rehabilitation  [phone] Pager [phone]    Elon Alaslizabeth B Van Fleet 12/07/2016, 5:12 PM

## 2016-12-07 NOTE — Progress Notes (Signed)
PROGRESS NOTE    Jaquelyn BitterStephen Scialdone  ZOX:[id number] DOB: 11/30/1940 DOA: 12/05/2016 PCP: System, Pcp Not In    Brief Narrative:  76 yo male presents with fever, confusion and cough. Patient known to have COPD and GERD, with symptoms for the last 7 days before admission, cough and dyspnea. Worsening over the last 3 days, associated with fever, confusion and loss of appetite. Refractive to outpatient therapy with antibiotic and inhaler. On the initial physical examination patient noted to be febrile at 38.1, oxygen saturation 90% on room air, HR 102, RR 27, with blood pressure 91/77. Confused and in respiratory distress, lungs with rhonchi at the right base, with expiratory wheezing. Heart S1 and S2 regular and tachycardic, abdomen soft and no lower extremity edema. Chest film with infiltrate at the right middle lobe. Admitted with the working diagnosis of sepsis due to community acquired pneumonia, complicated by impending acute hypoxic respiratory failure.    Assessment & Plan:   Principal Problem:   CAP (community acquired pneumonia) Active Problems:   Acute encephalopathy   Renal insufficiency, mild   GERD (gastroesophageal reflux disease)   COPD (chronic obstructive pulmonary disease) (HCC)   1. Sepsis due to community acquired pneumonia. Will continue antibiotic therapy with levofloxacin #2, will check urine antigen for Legionella and Strep pneumo. WBC at 9,7. T max 100.9 on 05/24 at 21:18. Blood cultures have been no growth. Will continue to follow cell count and temperature curve.   2. Impending hypoxemic respiratory failure. Improved oxygenation, 02 saturation at 96 % on room air. Will continue oxymetry monitoring. As needed bronchodilator therapy.   3. COPD. No signs of acute exacerbation, will continue oxymetry monitoring and as needed bronchodilator therapy. Chest film personally reviewed and noted hyperinflation.   4. AKI. Renal function with cr down to 1.0 from 1.27, will  continue conservative care, follow renal panel in am, avoid hypotension or nephrotoxic medications. Patient is off IV fluids.   5. Hypokalemia. Continue K correction with kcl, follow renal panel in am.  6. Anemia. Hb 12 to 10, no signs of bleeding, will continue monitoring as needed.     DVT prophylaxis: enoxaparin  Code Status: Full  Family Communication:  Disposition Plan: Home .   Consultants:     Procedures:    Antimicrobials:  Levofloxacin #2   Subjective: Patient noted to be confused and disorientated last night, not agitated. This am more orientated, denies dyspnea or chest pain, but positive weakness. At home had episodes of confusion and noted to have memory impairment. Lives in [place] and is planing to return.   Objective: Vitals:   12/06/16 1449 12/06/16 2118 12/07/16 0219 12/07/16 0655  BP: 104/68 (!) 128/53 (!) 101/55 (!) 107/55  Pulse: 67 82 73 71  Resp: 19 18 17 16   Temp: 99.6 F (37.6 C) (!) 100.9 F (38.3 C) 97.8 F (36.6 C) 99.1 F (37.3 C)  TempSrc: Oral Oral    SpO2: 100% 95% 98% 96%  Weight:      Height:        Intake/Output Summary (Last 24 hours) at 12/07/16 0912 Last data filed at 12/07/16 0655  Gross per 24 hour  Intake             1120 ml  Output              450 ml  Net              670 ml   Filed Weights   12/05/16 1545 12/05/16  2003  Weight: 66.2 kg (146 lb) 63.1 kg (139 lb 1.8 oz)    Examination:  General exam: not in pain or dyspnea E ENT: Mild pallor, no icterus, oral mucosa moist.  Respiratory system: Bibasilar rales, more right than left, no wheezing or rhonchi. No accessory muscle use.  Cardiovascular system: S1 & S2 heard, RRR. No JVD, murmurs, rubs, gallops or clicks. No pedal edema. Gastrointestinal system: Abdomen is nondistended, soft and nontender. No organomegaly or masses felt. Normal bowel sounds heard. Central nervous system: Alert and oriented. No focal neurological deficits. Extremities: Symmetric 5 x 5  power. Skin: No rashes, lesions or ulcers     Data Reviewed: I have personally reviewed following labs and imaging studies  CBC:  Recent Labs Lab 12/05/16 1500 12/06/16 0315  WBC 12.4* 9.7  NEUTROABS 10.6* 8.0*  HGB 12.7* 10.9*  HCT 39.0 34.2*  MCV 89.4 88.8  PLT 191 166   Basic Metabolic Panel:  Recent Labs Lab 12/05/16 1500 12/06/16 0315  NA 138 139  K 3.4* 3.4*  CL 105 109  CO2 23 25  GLUCOSE 110* 93  BUN 17 17  CREATININE 1.27* 1.04  CALCIUM 8.7* 8.1*  MG  --  2.0   GFR: Estimated Creatinine Clearance: 53.1 mL/min (by C-G formula based on SCr of 1.04 mg/dL). Liver Function Tests:  Recent Labs Lab 12/05/16 1500  AST 40  ALT 20  ALKPHOS 63  BILITOT 1.6*  PROT 6.3*  ALBUMIN 3.7   No results for input(s): LIPASE, AMYLASE in the last 168 hours. No results for input(s): AMMONIA in the last 168 hours. Coagulation Profile: No results for input(s): INR, PROTIME in the last 168 hours. Cardiac Enzymes: No results for input(s): CKTOTAL, CKMB, CKMBINDEX, TROPONINI in the last 168 hours. BNP (last 3 results) No results for input(s): PROBNP in the last 8760 hours. HbA1C: No results for input(s): HGBA1C in the last 72 hours. CBG: No results for input(s): GLUCAP in the last 168 hours. Lipid Profile: No results for input(s): CHOL, HDL, LDLCALC, TRIG, CHOLHDL, LDLDIRECT in the last 72 hours. Thyroid Function Tests: No results for input(s): TSH, T4TOTAL, FREET4, T3FREE, THYROIDAB in the last 72 hours. Anemia Panel: No results for input(s): VITAMINB12, FOLATE, FERRITIN, TIBC, IRON, RETICCTPCT in the last 72 hours. Sepsis Labs:  Recent Labs Lab 12/05/16 1527 12/05/16 1823  LATICACIDVEN 1.64 1.16    Recent Results (from the past 240 hour(s))  Culture, blood (Routine x 2)     Status: None (Preliminary result)   Collection Time: 12/05/16  2:57 PM  Result Value Ref Range Status   Specimen Description BLOOD LEFT ANTECUBITAL  Final   Special Requests   Final     BOTTLES DRAWN AEROBIC AND ANAEROBIC Blood Culture adequate volume   Culture NO GROWTH < 24 HOURS  Final   Report Status PENDING  Incomplete  Culture, blood (Routine x 2)     Status: None (Preliminary result)   Collection Time: 12/05/16  3:00 PM  Result Value Ref Range Status   Specimen Description BLOOD LEFT ANTECUBITAL  Final   Special Requests   Final    BOTTLES DRAWN AEROBIC AND ANAEROBIC Blood Culture adequate volume   Culture NO GROWTH 1 DAY  Final   Report Status PENDING  Incomplete         Radiology Studies: Dg Chest 2 View  Result Date: 12/05/2016 CLINICAL DATA:  Altered mental status and cough and congestion. Fever. EXAM: CHEST  2 VIEW COMPARISON:  None. FINDINGS: Evidence  for at least mild hyperinflation. There is concern for patchy densities along the medial right lung base which may be in the right middle lobe based on the lateral view. Bridging osteophytes in the thoracic spine. No large pleural effusions. Heart size is normal. IMPRESSION: Poorly defined densities in the right middle lobe region. Findings are concerning for pneumonia. Followup PA and lateral chest X-ray is recommended in 3-4 weeks following trial of antibiotic therapy to ensure resolution and exclude underlying malignancy. Electronically Signed   By: Richarda Overlie M.D.   On: 12/05/2016 15:14        Scheduled Meds: . diltiazem  120 mg Oral Daily  . enoxaparin (LOVENOX) injection  40 mg Subcutaneous Q24H  . guaiFENesin  600 mg Oral BID  . latanoprost  1 drop Both Eyes QHS  . pantoprazole  40 mg Oral Daily  . pneumococcal 23 valent vaccine  0.5 mL Intramuscular Tomorrow-1000  . potassium chloride SA  20 mEq Oral BID   Continuous Infusions: . levofloxacin (LEVAQUIN) IV Stopped (12/06/16 1652)     LOS: 2 days       Nickoles Gregori Annett Gula, MD [facility] Pager [phone]  If 7PM-7AM, please contact night-coverage [email or web address] Password [email or web address] 12/07/2016, 9:12 AM

## 2016-12-08 DIAGNOSIS — R262 Difficulty in walking, not elsewhere classified: Secondary | ICD-10-CM

## 2016-12-08 DIAGNOSIS — A419 Sepsis, unspecified organism: Principal | ICD-10-CM

## 2016-12-08 DIAGNOSIS — N179 Acute kidney failure, unspecified: Secondary | ICD-10-CM

## 2016-12-08 LAB — BASIC METABOLIC PANEL
Anion gap: 6 (ref 5–15)
BUN: 11 mg/dL (ref 6–20)
CHLORIDE: 108 mmol/L (ref 101–111)
CO2: 23 mmol/L (ref 22–32)
CREATININE: 0.86 mg/dL (ref 0.61–1.24)
Calcium: 8.4 mg/dL — ABNORMAL LOW (ref 8.9–10.3)
GFR calc Af Amer: >60 mL/min (ref >60)
GLUCOSE: 88 mg/dL (ref 65–99)
Potassium: 3.9 mmol/L (ref 3.5–5.1)
SODIUM: 137 mmol/L (ref 135–145)

## 2016-12-08 LAB — CBC WITH DIFFERENTIAL/PLATELET
Basophils Absolute: 0 10*3/uL (ref 0.0–0.1)
Basophils Relative: 0 %
EOS ABS: 0.1 10*3/uL (ref 0.0–0.7)
EOS PCT: 1 %
HCT: 34 % — ABNORMAL LOW (ref 39.0–52.0)
Hemoglobin: 11.1 g/dL — ABNORMAL LOW (ref 13.0–17.0)
LYMPHS ABS: 1.3 10*3/uL (ref 0.7–4.0)
Lymphocytes Relative: 17 %
MCH: 28.3 pg (ref 26.0–34.0)
MCHC: 32.6 g/dL (ref 30.0–36.0)
MCV: 86.7 fL (ref 78.0–100.0)
MONOS PCT: 9 %
Monocytes Absolute: 0.7 10*3/uL (ref 0.1–1.0)
Neutro Abs: 5.5 10*3/uL (ref 1.7–7.7)
Neutrophils Relative %: 73 %
PLATELETS: 213 10*3/uL (ref 150–400)
RBC: 3.92 MIL/uL — ABNORMAL LOW (ref 4.22–5.81)
RDW: 12.9 % (ref 11.5–15.5)
WBC: 7.5 10*3/uL (ref 4.0–10.5)

## 2016-12-08 LAB — LEGIONELLA PNEUMOPHILA SEROGP 1 UR AG: L. pneumophila Serogp 1 Ur Ag: NEGATIVE

## 2016-12-08 MED ORDER — DILTIAZEM HCL ER 120 MG PO CP24: 120.0000 mg | ORAL_CAPSULE | Freq: Every day | ORAL | 0 refills | Status: AC

## 2016-12-08 MED ORDER — IPRATROPIUM-ALBUTEROL 0.5-2.5 (3) MG/3ML IN SOLN: 3.0000 mL | Freq: Four times a day (QID) | RESPIRATORY_TRACT | 0 refills | Status: AC | PRN

## 2016-12-08 MED ORDER — ONDANSETRON HCL 4 MG PO TABS: 4.0000 mg | ORAL_TABLET | Freq: Every day | ORAL | 0 refills | Status: AC | PRN

## 2016-12-08 MED ORDER — LATANOPROST 0.005 % OP SOLN: 1.0000 [drp] | Freq: Every day | OPHTHALMIC | 0 refills | Status: AC

## 2016-12-08 MED ORDER — PANTOPRAZOLE SODIUM 40 MG PO TBEC: 40.0000 mg | DELAYED_RELEASE_TABLET | Freq: Every day | ORAL | 0 refills | Status: AC

## 2016-12-08 MED ORDER — POTASSIUM CHLORIDE CRYS ER 20 MEQ PO TBCR: 20.0000 meq | EXTENDED_RELEASE_TABLET | Freq: Two times a day (BID) | ORAL | 0 refills | Status: AC

## 2016-12-08 MED ORDER — ONDANSETRON HCL 4 MG PO TABS
4.0000 mg | ORAL_TABLET | ORAL | Status: DC | PRN
  Administered 2016-12-08: 4 mg via ORAL
  Filled 2016-12-08: qty 1

## 2016-12-08 MED ORDER — LEVOFLOXACIN 750 MG PO TABS: 750.0000 mg | ORAL_TABLET | Freq: Every day | ORAL | Status: DC

## 2016-12-08 MED ORDER — LEVOFLOXACIN 750 MG PO TABS: 750.0000 mg | ORAL_TABLET | Freq: Every day | ORAL | 0 refills | Status: AC

## 2016-12-08 NOTE — Care Management Note (Signed)
Case Management Note  Patient Details  Name: Vincent Costa MRN: [id number] Date of Birth: 06/17/1941  Subjective/Objective:                 Spoke with patient, [facility] and Rolator to be delivered to room prior to DC.   Action/Plan:   Expected Discharge Date:  12/08/16               Expected Discharge Plan:  Home w Home Health Services  In-House Referral:     Discharge planning Services  CM Consult  Post Acute Care Choice:  Home Health, Durable Medical Equipment Choice offered to:  Patient  DME Arranged:  Walker rolling with seat DME Agency:  [facility]  [facility]  [facility]  Status of Service:  Completed, signed off  If discussed at Long Length of Stay Meetings, dates discussed:    Additional Comments:  Lawerance SabalDebbie Kafi Dotter, RN 12/08/2016, 11:32 AM

## 2016-12-08 NOTE — Discharge Summary (Addendum)
Physician Discharge Summary  Vincent Costa:[id number] DOB: Aug 21, 1940 DOA: 12/05/2016  PCP: System, Pcp Not In  Admit date: 12/05/2016 Discharge date: 12/08/2016  Admitted From: Home  Disposition:  Home   Recommendations for Outpatient Follow-up:  1. Follow up with PCP in 1- weeks 2. Levofloxacin to complete May 31st 3. Patient will need home health with supervision/ assistance 24 Hours.   Home Health: Yes Equipment/Devices: Rolling walker  Discharge Condition: Stable  CODE STATUS: Full  Diet recommendation: Regular   Brief/Interim Summary:  76 yo male presents with fever, confusion and cough. Patient known to have COPD and GERD, with symptoms for the last 7 days before admission, cough and dyspnea. Worsening over the last 3 days, associated with fever, confusion and loss of appetite. Refractive to outpatient therapy with antibiotic and inhaler. On the initial physical examination patient noted to be febrile at 38.1, oxygen saturation 90% on room air, HR 102, RR 27, with blood pressure 91/77. Confused and in respiratory distress, lungs with rhonchi at the right base, with expiratory wheezing. Heart S1 and S2 regular and tachycardic, abdomen soft and no lower extremity edema. Chest film with infiltrate at the right middle lobe. Sodium 138, potassium 3.4, chloride 105, bicarbonate 23, glucose 110, BUN 17, creatinine 1.27, AST 40, ALT 20, white count 12.4, hemoglobin 12.7, hematocrit 39.0, platelets 109, urinalysis negative for infection, EKG normal sinus rhythm, 90 bpm. No ST elevations or ST depressions, no significant T-wave abnormalities.   Patient admitted with the working diagnosis of sepsis due to community acquired pneumonia, complicated by impending acute hypoxic respiratory failure and acute kidney injury.   1. Sepsis due to community acquired pneumonia. (Present on admission) Patient was admitted to the medical ward, placed on IV antibiotics with levofloxacin, IV fluids,  oximetry monitoring and supplemental oxygen per nasal cannula. Blood cultures remain no growth, urinary strep pneumo antigen was negative, Legionella urinary antigen pending. Patient was transitioned to oral antibiotics with no major complications. Will plan to continue antibiotic therapy to complete 8 days, to finish on May 31. Patient has remained afebrile for last 24 hours.  2. Impending acute hypoxemic respiratory failure. Patient was placed on supplemental oxygen per nasal cannula with good toleration, by the time of discharge his oximetry on room air was 100%, significant improvement of his dyspnea.  3. COPD. No signs of acute exacerbation, patient's x-ray, personally reviewed, showing significant hyperinflation, will discharge patient on bronchodilator therapy with DuoNeb, nebulizer machine has been prescribed. Patient will need full outpatient pulmonary function testing, high pretest probability for, bronchitis/emphysema.    4. Acute kidney injury. Due to sepsis syndrome, patient received IV fluids with isotonic saline, kidney function improved, discharge creatinine 0.86, peak at 1.27. Potassium 3.9 with serum sodium 137. Patient will continue home potassium supplements.   5. Chronic multifactorial anemia. No signs of acute bleeding, discharge hemoglobin 11.1. Will need follow-up as an outpatient.   6. Dysrhythmia. Patient remained on sinus rhythm, he will resume his diltiazem 120 mg daily, follow-up as an outpatient. His primary care is in [place].   Discharge Diagnoses:  Principal Problem:   CAP (community acquired pneumonia) Active Problems:   Acute encephalopathy   Renal insufficiency, mild   GERD (gastroesophageal reflux disease)   COPD (chronic obstructive pulmonary disease) (HCC)    Discharge Instructions   Allergies as of 12/08/2016      Reactions   Aspirin    Codeine    Lyrica [pregabalin]       Medication List  TAKE these medications   diltiazem 120 MG 24 hr  capsule Commonly known as:  DILACOR XR Take 120 mg by mouth daily.   ipratropium-albuterol 0.5-2.5 (3) MG/3ML Soln Commonly known as:  DUONEB Take 3 mLs by nebulization every 6 (six) hours as needed.   latanoprost 0.005 % ophthalmic solution Commonly known as:  XALATAN Place 1 drop into both eyes at bedtime.   levofloxacin 750 MG tablet Commonly known as:  LEVAQUIN Take 1 tablet (750 mg total) by mouth daily. Start taking on:  12/09/2016   ondansetron 4 MG tablet Commonly known as:  ZOFRAN Take 1 tablet (4 mg total) by mouth daily as needed for nausea or vomiting.   pantoprazole 40 MG tablet Commonly known as:  PROTONIX Take 40 mg by mouth daily.   potassium chloride SA 20 MEQ tablet Commonly known as:  K-DUR,KLOR-CON Take 20 mEq by mouth 2 (two) times daily.            Durable Medical Equipment        Start     Ordered   12/08/16 0000  DME Nebulizer machine    Question Answer Comment  Patient needs a nebulizer to treat with the following condition Pneumonia   Patient needs a nebulizer to treat with the following condition Bronchitis      12/08/16 1024      Allergies  Allergen Reactions  . Aspirin   . Codeine   . Lyrica [Pregabalin]     Consultations:     Procedures/Studies: Dg Chest 2 View  Result Date: 12/05/2016 CLINICAL DATA:  Altered mental status and cough and congestion. Fever. EXAM: CHEST  2 VIEW COMPARISON:  None. FINDINGS: Evidence for at least mild hyperinflation. There is concern for patchy densities along the medial right lung base which may be in the right middle lobe based on the lateral view. Bridging osteophytes in the thoracic spine. No large pleural effusions. Heart size is normal. IMPRESSION: Poorly defined densities in the right middle lobe region. Findings are concerning for pneumonia. Followup PA and lateral chest X-ray is recommended in 3-4 weeks following trial of antibiotic therapy to ensure resolution and exclude underlying  malignancy. Electronically Signed   By: Richarda Overlie M.D.   On: 12/05/2016 15:14       Subjective: Patient had nausea this am, no chest pain or dyspnea, positive weakness and deconditioning.   Discharge Exam: Vitals:   12/07/16 2043 12/08/16 0527  BP: 111/63 115/67  Pulse: 72 84  Resp: 18 17  Temp: 98.6 F (37 C) 99.1 F (37.3 C)   Vitals:   12/07/16 1018 12/07/16 1335 12/07/16 2043 12/08/16 0527  BP: 123/67 125/80 111/63 115/67  Pulse:  69 72 84  Resp:  17 18 17   Temp:  98.3 F (36.8 C) 98.6 F (37 C) 99.1 F (37.3 C)  TempSrc:  Oral Oral   SpO2:  97% 100% 100%  Weight:      Height:        General: Pt is alert, awake, not in acute distress E ENT. Mild pallor, no icterus, oral mucosa moist.  Cardiovascular: RRR, S1/S2 +, no rubs, no gallops Respiratory: , good inspiratory effort, no wheezing, no rhonchi, mild rales at bases.  Abdominal: Soft, NT, ND, bowel sounds + Extremities: no edema, no cyanosis    The results of significant diagnostics from this hospitalization (including imaging, microbiology, ancillary and laboratory) are listed below for reference.     Microbiology: Recent Results (from the past 240  hour(s))  Culture, blood (Routine x 2)     Status: None (Preliminary result)   Collection Time: 12/05/16  2:57 PM  Result Value Ref Range Status   Specimen Description BLOOD LEFT ANTECUBITAL  Final   Special Requests   Final    BOTTLES DRAWN AEROBIC AND ANAEROBIC Blood Culture adequate volume   Culture NO GROWTH 2 DAYS  Final   Report Status PENDING  Incomplete  Culture, blood (Routine x 2)     Status: None (Preliminary result)   Collection Time: 12/05/16  3:00 PM  Result Value Ref Range Status   Specimen Description BLOOD LEFT ANTECUBITAL  Final   Special Requests   Final    BOTTLES DRAWN AEROBIC AND ANAEROBIC Blood Culture adequate volume   Culture NO GROWTH 2 DAYS  Final   Report Status PENDING  Incomplete     Labs: BNP (last 3 results) No  results for input(s): BNP in the last 8760 hours. Basic Metabolic Panel:  Recent Labs Lab 12/05/16 1500 12/06/16 0315 12/08/16 0439  NA 138 139 137  K 3.4* 3.4* 3.9  CL 105 109 108  CO2 23 25 23   GLUCOSE 110* 93 88  BUN 17 17 11   CREATININE 1.27* 1.04 0.86  CALCIUM 8.7* 8.1* 8.4*  MG  --  2.0  --    Liver Function Tests:  Recent Labs Lab 12/05/16 1500  AST 40  ALT 20  ALKPHOS 63  BILITOT 1.6*  PROT 6.3*  ALBUMIN 3.7   No results for input(s): LIPASE, AMYLASE in the last 168 hours. No results for input(s): AMMONIA in the last 168 hours. CBC:  Recent Labs Lab 12/05/16 1500 12/06/16 0315 12/08/16 0439  WBC 12.4* 9.7 7.5  NEUTROABS 10.6* 8.0* 5.5  HGB 12.7* 10.9* 11.1*  HCT 39.0 34.2* 34.0*  MCV 89.4 88.8 86.7  PLT 191 166 213   Cardiac Enzymes: No results for input(s): CKTOTAL, CKMB, CKMBINDEX, TROPONINI in the last 168 hours. BNP: Invalid input(s): POCBNP CBG: No results for input(s): GLUCAP in the last 168 hours. D-Dimer No results for input(s): DDIMER in the last 72 hours. Hgb A1c No results for input(s): HGBA1C in the last 72 hours. Lipid Profile No results for input(s): CHOL, HDL, LDLCALC, TRIG, CHOLHDL, LDLDIRECT in the last 72 hours. Thyroid function studies No results for input(s): TSH, T4TOTAL, T3FREE, THYROIDAB in the last 72 hours.  Invalid input(s): FREET3 Anemia work up No results for input(s): VITAMINB12, FOLATE, FERRITIN, TIBC, IRON, RETICCTPCT in the last 72 hours. Urinalysis    Component Value Date/Time   COLORURINE AMBER (A) 12/05/2016 1644   APPEARANCEUR HAZY (A) 12/05/2016 1644   LABSPEC 1.027 12/05/2016 1644   PHURINE 5.0 12/05/2016 1644   GLUCOSEU NEGATIVE 12/05/2016 1644   HGBUR MODERATE (A) 12/05/2016 1644   BILIRUBINUR NEGATIVE 12/05/2016 1644   KETONESUR NEGATIVE 12/05/2016 1644   PROTEINUR 100 (A) 12/05/2016 1644   NITRITE NEGATIVE 12/05/2016 1644   LEUKOCYTESUR NEGATIVE 12/05/2016 1644   Sepsis Labs Invalid  input(s): PROCALCITONIN,  WBC,  LACTICIDVEN Microbiology Recent Results (from the past 240 hour(s))  Culture, blood (Routine x 2)     Status: None (Preliminary result)   Collection Time: 12/05/16  2:57 PM  Result Value Ref Range Status   Specimen Description BLOOD LEFT ANTECUBITAL  Final   Special Requests   Final    BOTTLES DRAWN AEROBIC AND ANAEROBIC Blood Culture adequate volume   Culture NO GROWTH 2 DAYS  Final   Report Status PENDING  Incomplete  Culture, blood (Routine x 2)     Status: None (Preliminary result)   Collection Time: 12/05/16  3:00 PM  Result Value Ref Range Status   Specimen Description BLOOD LEFT ANTECUBITAL  Final   Special Requests   Final    BOTTLES DRAWN AEROBIC AND ANAEROBIC Blood Culture adequate volume   Culture NO GROWTH 2 DAYS  Final   Report Status PENDING  Incomplete     Time coordinating discharge: 45 minutes  SIGNED:   Coralie KeensMauricio Daniel Declynn Lopresti, MD  [facility] 12/08/2016, 10:08 AM Pager   If 7PM-7AM, please contact night-coverage [email or web address] Password [email or web address]

## 2016-12-10 LAB — CULTURE, BLOOD (ROUTINE X 2)
CULTURE: NO GROWTH
Culture: NO GROWTH
Special Requests: ADEQUATE
Special Requests: ADEQUATE

## 2017-11-01 IMAGING — CR DG CHEST 2V
3 series · 3 of 3 positions shown · non-contrast
Comparison: None.

CLINICAL DATA: Altered mental status and cough and congestion.
Fever.

EXAM:
CHEST  2 VIEW

[chest pa (1 of 2)]
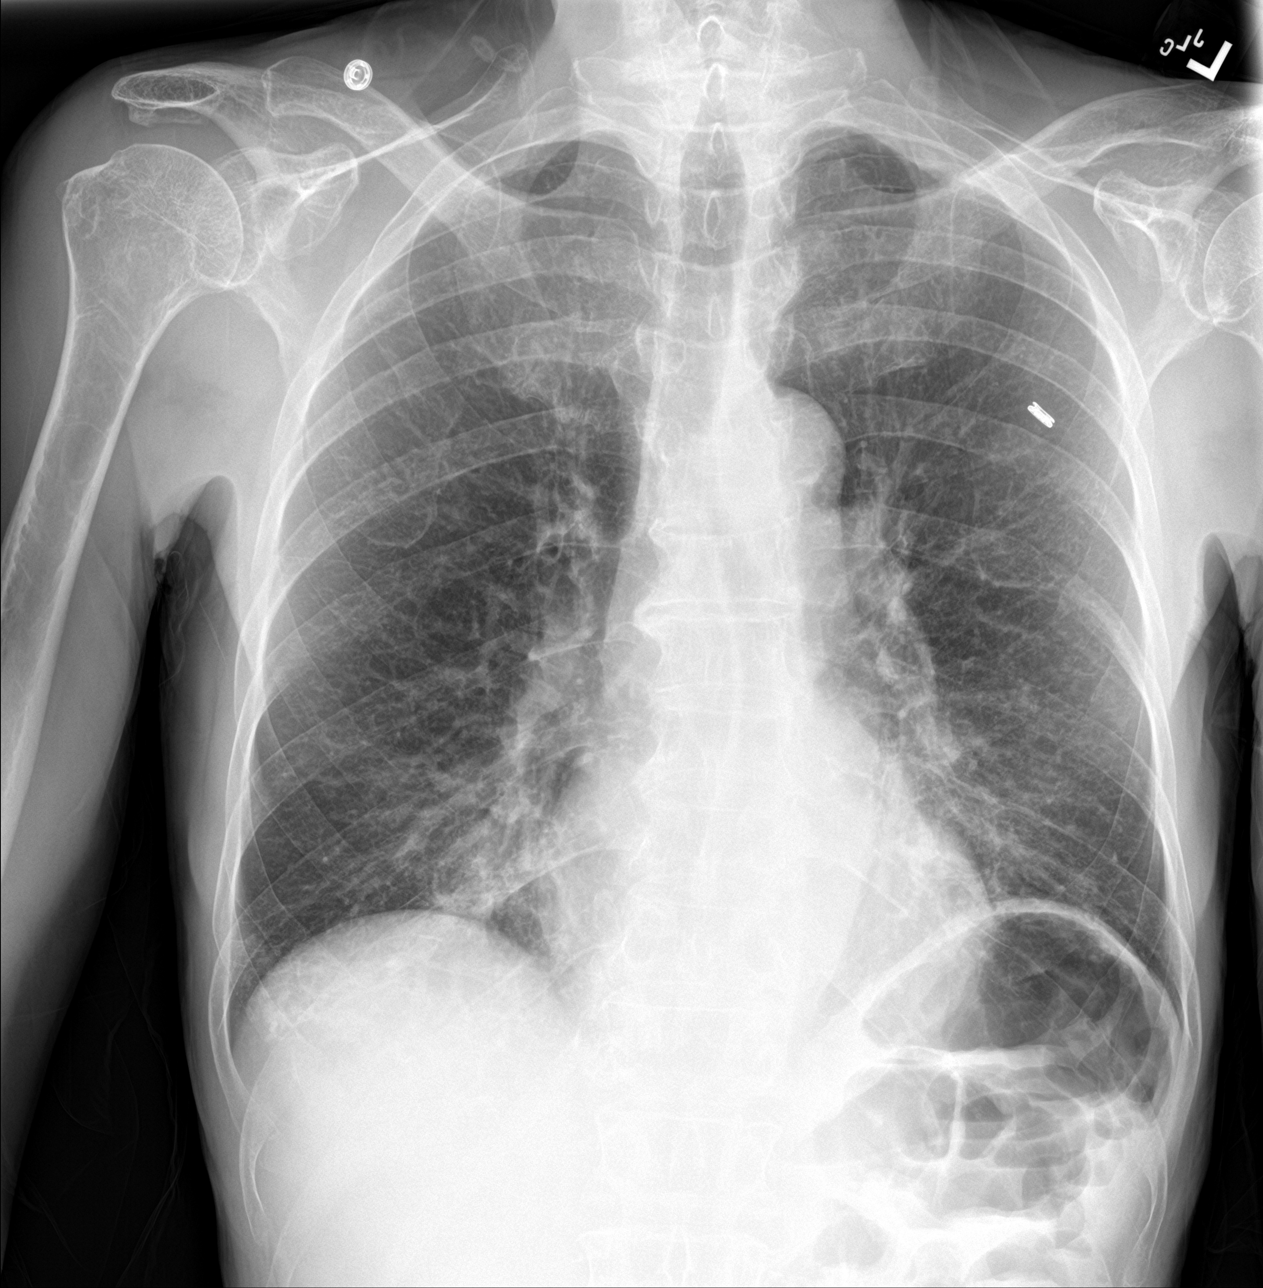

[chest lat]
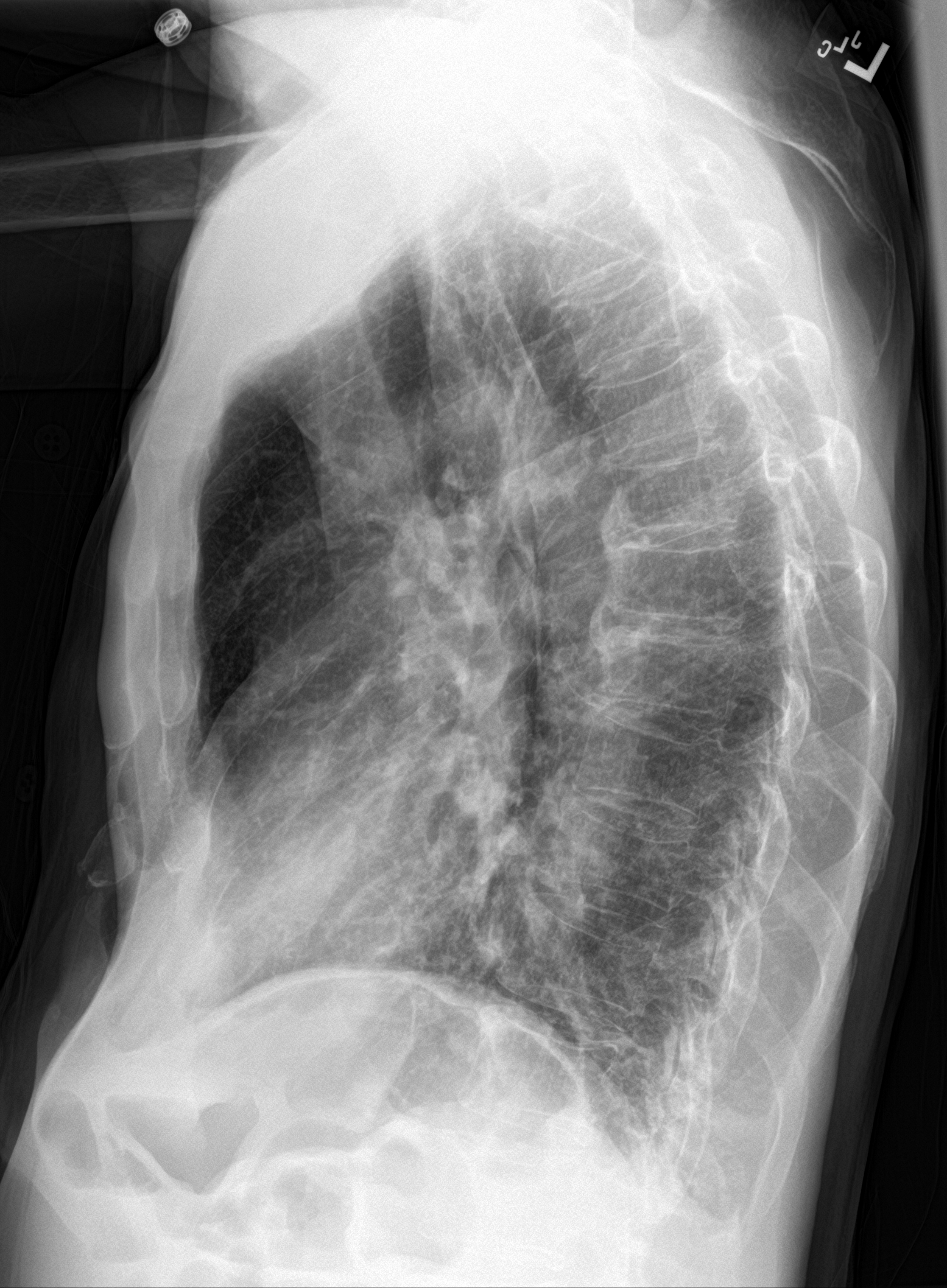

[chest pa (2 of 2)]
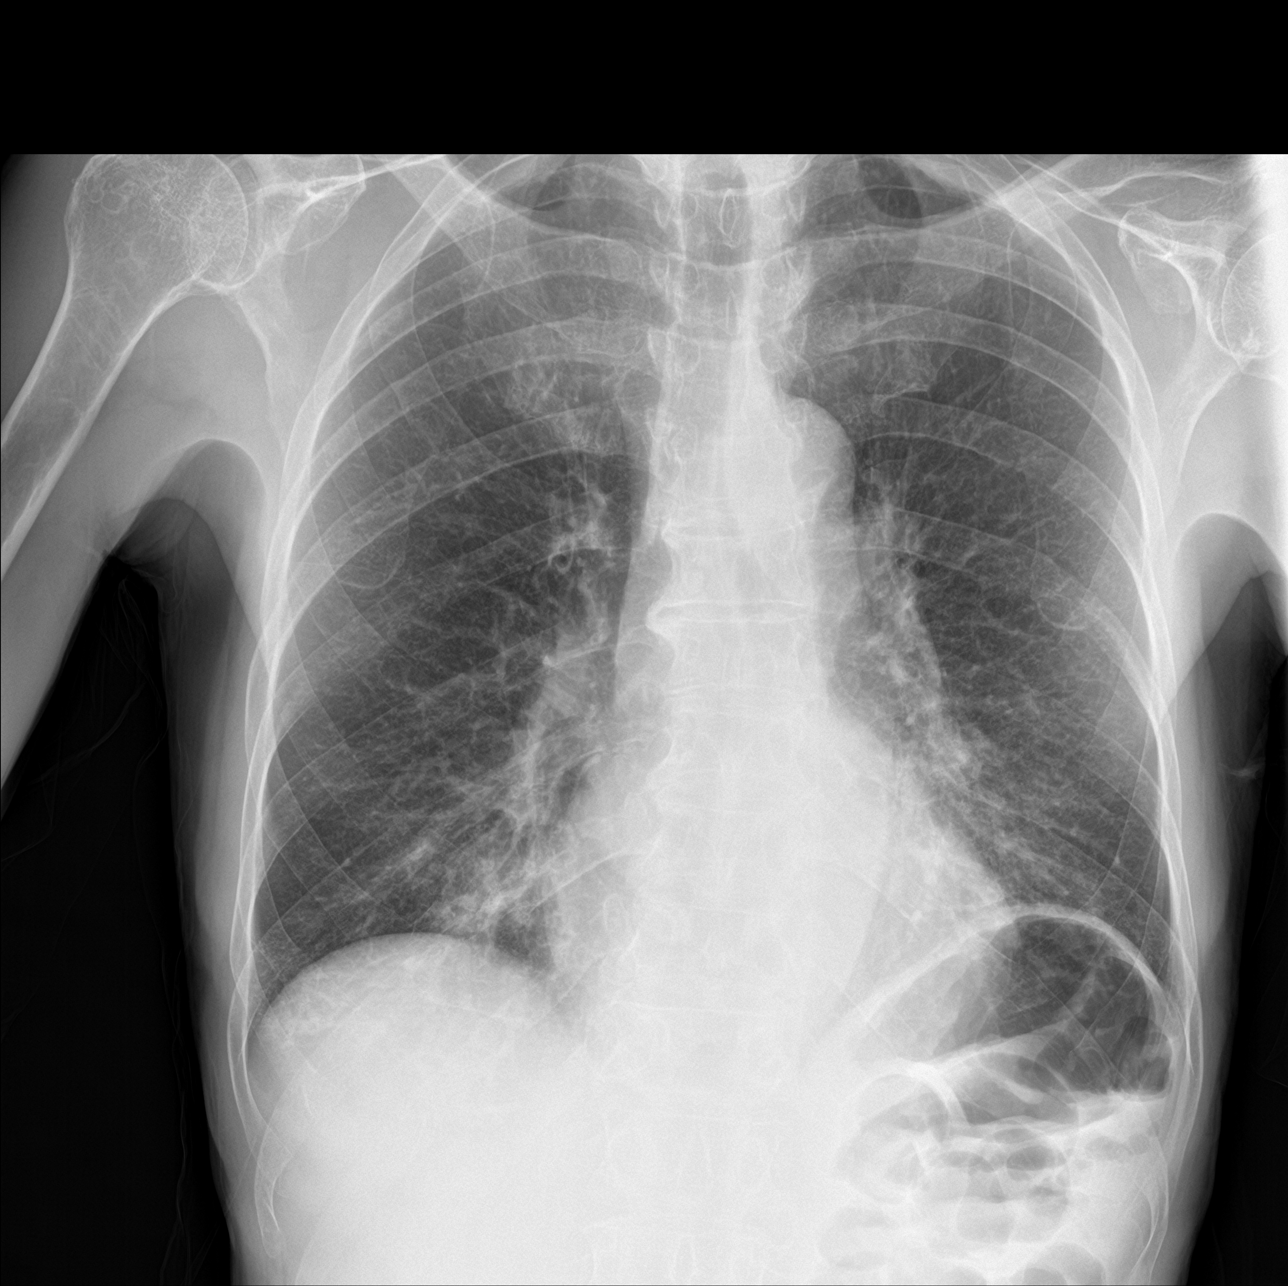

[3 of 3 positions shown; findings below may reference images not displayed]

FINDINGS: Evidence for at least mild hyperinflation. There is concern for
patchy densities along the medial right lung base which may be in
the right middle lobe based on the lateral view. Bridging
osteophytes in the thoracic spine. No large pleural effusions. Heart
size is normal.
IMPRESSION: Poorly defined densities in the right middle lobe region. Findings
are concerning for pneumonia. Followup PA and lateral chest X-ray is
recommended in 3-4 weeks following trial of antibiotic therapy to
ensure resolution and exclude underlying malignancy.

## 2019-02-14 DEATH — deceased
